# Patient Record
Sex: Female | Born: 1955 | Race: White | Hispanic: No | Smoking: Current every day smoker
Health system: Southern US, Community
[De-identification: ages and names within clinical notes are randomized; demographics above are authoritative.]

## PROBLEM LIST (undated history)

## (undated) DIAGNOSIS — I10 Essential (primary) hypertension: Secondary | ICD-10-CM

## (undated) DIAGNOSIS — F419 Anxiety disorder, unspecified: Secondary | ICD-10-CM

## (undated) HISTORY — PX: KIDNEY SURGERY: SHX687

---

## 2018-01-28 ENCOUNTER — Encounter (HOSPITAL_COMMUNITY): Payer: Self-pay | Admitting: Emergency Medicine

## 2018-01-28 ENCOUNTER — Emergency Department (HOSPITAL_COMMUNITY)
Admission: EM | Admit: 2018-01-28 | Discharge: 2018-01-28 | Disposition: A | Discharge status: Home / Self Care | Payer: Medicare Other | Attending: Emergency Medicine | Admitting: Emergency Medicine

## 2018-01-28 DIAGNOSIS — M25512 Pain in left shoulder: Secondary | ICD-10-CM | POA: Diagnosis not present

## 2018-01-28 DIAGNOSIS — R590 Localized enlarged lymph nodes: Secondary | ICD-10-CM | POA: Diagnosis not present

## 2018-01-28 DIAGNOSIS — M542 Cervicalgia: Secondary | ICD-10-CM | POA: Diagnosis present

## 2018-01-28 DIAGNOSIS — F17200 Nicotine dependence, unspecified, uncomplicated: Secondary | ICD-10-CM | POA: Diagnosis not present

## 2018-01-28 DIAGNOSIS — M25511 Pain in right shoulder: Secondary | ICD-10-CM | POA: Insufficient documentation

## 2018-01-28 HISTORY — DX: Anxiety disorder, unspecified: F41.9

## 2018-01-28 MED ORDER — CYCLOBENZAPRINE HCL 10 MG PO TABS: 10.0000 mg | ORAL_TABLET | Freq: Two times a day (BID) | ORAL | 0 refills | Status: DC | PRN

## 2018-01-28 NOTE — Discharge Instructions (Signed)
Please schedule an appointment with the ear, nose and throat doctor for further follow-up and evaluation of your enlarged lymph node.  Call the number listed below for Dr. Karren Burlywight.  Use muscle relaxer medicine called Flexeril.  This medicine can make you drowsy so please do not drive or work while taking it.  Continue to take Tylenol for neck strain and apply heat over the neck.  Your blood pressure was somewhat elevated in the emergency department today, please have this rechecked.  Return to the emergency department if you have fever greater than 100.4 F, sore throat or have any new or concerning symptoms.

## 2018-01-28 NOTE — ED Triage Notes (Signed)
Pt. Stated. Ive had a knot on my neck for over 4 months , unable to move my neck now.

## 2018-01-28 NOTE — ED Provider Notes (Signed)
[facility] EMERGENCY DEPARTMENT Provider Note   CSN: [id number] Arrival date & time: 01/28/18  4098     History   Chief Complaint Chief Complaint  Patient presents with  . Torticollis    HPI Evelyn Mitchell is a 62 y.o. Mitchell.  HPI   Evelyn Mitchell is a 62yo Mitchell with a history of anxiety who presents to the emergency department for evaluation of "knot over her left neck."  Patient reports that 4 months ago she noticed a knot over the left side of her neck which has enlarged and is tender to the touch.  She reports that it has made her entire neck "tight and painful." Patient reports 10/10 severity "stabbing pain in the neck, particularly with palpation over the knot or with neck rotation.  She also reports that the bilateral shoulders feel tight which has been giving her occasional migraine headaches.  She has been using BenGay, heat over the neck, aspirin without significant improvement in her pain.  She denies fevers, chills, night sweats, unexpected weight change, chest pain, shortness of breath, vision changes, numbness, weakness, nausea/vomiting.  She is able to swallow without difficulty.  Denies history of HIV or cancer.  She denies having painful lumps elsewhere.  Past Medical History:  Diagnosis Date  . Anxiety     There are no active problems to display for this patient.   History reviewed. No pertinent surgical history.   OB History   None      Home Medications    Prior to Admission medications   Not on File    Family History No family history on file.  Social History Social History   Tobacco Use  . Smoking status: Current Every Day Smoker  . Smokeless tobacco: Current User  Substance Use Topics  . Alcohol use: Not Currently  . Drug use: Not Currently     Allergies   Patient has no allergy information on record.   Review of Systems Review of Systems  Constitutional: Negative for chills, diaphoresis, fatigue, fever and  unexpected weight change.  HENT: Negative for congestion, rhinorrhea, sore throat and trouble swallowing.   Eyes: Negative for visual disturbance.  Respiratory: Negative for cough and shortness of breath.   Cardiovascular: Negative for chest pain.  Gastrointestinal: Negative for abdominal pain, nausea and vomiting.  Musculoskeletal: Positive for neck pain. Negative for neck stiffness.  Skin: Negative for rash.  Neurological: Negative for weakness and numbness.     Physical Exam Updated Vital Signs BP (!) 125/97 (BP Location: Right Arm)   Pulse 100   Temp 98.3 F (36.8 C)   Resp 18   Ht 5\' 5"  (1.651 m)   Wt 72.6 kg (160 lb)   SpO2 98%   BMI 26.63 kg/m   Physical Exam  Constitutional: She is oriented to person, place, and time. She appears well-developed and well-nourished. No distress.  HENT:  Head: Normocephalic and atraumatic.  Mouth/Throat: Oropharynx is clear and moist. No oropharyngeal exudate.  Eyes: Pupils are equal, round, and reactive to light. Conjunctivae are normal. Right eye exhibits no discharge. Left eye exhibits no discharge.  Neck:    One enlarged (2 cm) anterior cervical chain lymph node on the left.  It is immobile, tender to the touch.  No overlying erythema or rash on the skin.  No posterior chain, postauricular or preauricular lymphadenopathy. She also exhibits tenderness over bilateral upper trapezius muscles. Range of motion normal, although painful with rotation.  Cardiovascular: Normal rate, regular rhythm  and intact distal pulses. Exam reveals no friction rub.  No murmur heard. Pulmonary/Chest: Effort normal and breath sounds normal. No stridor. No respiratory distress. She has no wheezes. She has no rales.  Musculoskeletal:  No axillary or inguinal lymphadenopathy.  Neurological: She is alert and oriented to person, place, and time. Coordination normal.  Distal sensation to light touch intact in bilateral upper extremities.  Grip strength 5/5 in  bilateral upper extremities.  Skin: Skin is warm and dry. Capillary refill takes less than 2 seconds. She is not diaphoretic.  Psychiatric: She has a normal mood and affect. Her behavior is normal.  Nursing note and vitals reviewed.    ED Treatments / Results  Labs (all labs ordered are listed, but only abnormal results are displayed) Labs Reviewed - No data to display  EKG None  Radiology No results found.  Procedures Procedures (including critical care time)  Medications Ordered in ED Medications - No data to display   Initial Impression / Assessment and Plan / ED Course  I have reviewed the triage vital signs and the nursing notes.  Pertinent labs & imaging results that were available during my care of the patient were reviewed by me and considered in my medical decision making (see chart for details).     Patient with isolated tender cervical lymph node for the past four months now. Denies recent URI. Denies history of HIV or cancer. Denies fever, chills, night sweats, unexpected weight change. No erythema, warmth or induration overlying the skin. No axial or inguinal lymphadenopathy. No trismus or difficulty with range of motion of neck. Differential includes reactive lymph node vs. lymphoma.  Do not suspect peritonsillar abscess, retropharyngeal abscess or other deep space infection. No respiratory difficulty. Patient is swallowing well. No indication for advanced imaging at that time. Patient counseled to schedule an appointment with ENT for further f/u. Patient also exhibits tenderness and tightness over bilateral upper trapezius muscles, suspect this is related to positioning in the setting of trying to avoid laying on the left cervical node. Will d/c with muscle relaxer and instructions for heat and slow ROM activities. Discussed return precautions and she agrees. Discussed this patient with Dr. Charm BargesButler who agrees with above plan and appropriate follow up with ENT.   Final  Clinical Impressions(s) / ED Diagnoses   Final diagnoses:  LAD (lymphadenopathy) of left cervical region    ED Discharge Orders        Ordered    cyclobenzaprine (FLEXERIL) 10 MG tablet  2 times daily PRN     01/28/18 0947       Kellie ShropshireShrosbree, Madyn Ivins J, PA-C 01/29/18 0926    Terrilee FilesButler, Michael C, MD 01/30/18 [phone]

## 2018-03-06 DIAGNOSIS — R221 Localized swelling, mass and lump, neck: Secondary | ICD-10-CM | POA: Insufficient documentation

## 2018-03-07 ENCOUNTER — Other Ambulatory Visit: Payer: Self-pay | Admitting: Otolaryngology

## 2018-03-07 DIAGNOSIS — R221 Localized swelling, mass and lump, neck: Secondary | ICD-10-CM

## 2018-03-15 ENCOUNTER — Other Ambulatory Visit: Payer: Medicare Other

## 2018-03-21 ENCOUNTER — Ambulatory Visit
Admission: RE | Admit: 2018-03-21 | Discharge: 2018-03-21 | Disposition: A | Discharge status: Home / Self Care | Payer: Medicare Other | Source: Ambulatory Visit | Attending: Otolaryngology | Admitting: Otolaryngology

## 2018-03-21 DIAGNOSIS — R221 Localized swelling, mass and lump, neck: Secondary | ICD-10-CM

## 2018-03-21 MED ORDER — IOPAMIDOL (ISOVUE-300) INJECTION 61%
75.0000 mL | Freq: Once | INTRAVENOUS | Status: AC | PRN
  Administered 2018-03-21: 75 mL via INTRAVENOUS

## 2018-12-01 ENCOUNTER — Emergency Department (HOSPITAL_COMMUNITY)
Admission: EM | Admit: 2018-12-01 | Discharge: 2018-12-01 | Disposition: A | Discharge status: Home / Self Care | Payer: Medicare HMO | Attending: Emergency Medicine | Admitting: Emergency Medicine

## 2018-12-01 ENCOUNTER — Encounter (HOSPITAL_COMMUNITY): Payer: Self-pay | Admitting: Emergency Medicine

## 2018-12-01 ENCOUNTER — Emergency Department (HOSPITAL_COMMUNITY): Payer: Medicare HMO

## 2018-12-01 DIAGNOSIS — I10 Essential (primary) hypertension: Secondary | ICD-10-CM | POA: Insufficient documentation

## 2018-12-01 DIAGNOSIS — R519 Headache, unspecified: Secondary | ICD-10-CM

## 2018-12-01 DIAGNOSIS — F17210 Nicotine dependence, cigarettes, uncomplicated: Secondary | ICD-10-CM | POA: Insufficient documentation

## 2018-12-01 DIAGNOSIS — R51 Headache: Secondary | ICD-10-CM | POA: Insufficient documentation

## 2018-12-01 HISTORY — DX: Essential (primary) hypertension: I10

## 2018-12-01 LAB — COMPREHENSIVE METABOLIC PANEL
ALBUMIN: 4.1 g/dL (ref 3.5–5.0)
ALK PHOS: 62 U/L (ref 38–126)
ALT: 18 U/L (ref 0–44)
AST: 21 U/L (ref 15–41)
Anion gap: 10 (ref 5–15)
BUN: 11 mg/dL (ref 8–23)
CALCIUM: 8.9 mg/dL (ref 8.9–10.3)
CO2: 24 mmol/L (ref 22–32)
CREATININE: 0.76 mg/dL (ref 0.44–1.00)
Chloride: 104 mmol/L (ref 98–111)
GFR calc Af Amer: >60 mL/min (ref >60)
GFR calc non Af Amer: >60 mL/min (ref >60)
GLUCOSE: 108 mg/dL — AB (ref 70–99)
Potassium: 3.7 mmol/L (ref 3.5–5.1)
Sodium: 138 mmol/L (ref 135–145)
Total Bilirubin: 0.6 mg/dL (ref 0.3–1.2)
Total Protein: 6.2 g/dL — ABNORMAL LOW (ref 6.5–8.1)

## 2018-12-01 LAB — CBC WITH DIFFERENTIAL/PLATELET
ABS IMMATURE GRANULOCYTES: 0.05 10*3/uL (ref 0.00–0.07)
BASOS ABS: 0 10*3/uL (ref 0.0–0.1)
Basophils Relative: 0 %
Eosinophils Absolute: 0.2 10*3/uL (ref 0.0–0.5)
Eosinophils Relative: 2 %
HCT: 39.2 % (ref 36.0–46.0)
HEMOGLOBIN: 12.6 g/dL (ref 12.0–15.0)
IMMATURE GRANULOCYTES: 1 %
LYMPHS PCT: 24 %
Lymphs Abs: 2.2 10*3/uL (ref 0.7–4.0)
MCH: 32.1 pg (ref 26.0–34.0)
MCHC: 32.1 g/dL (ref 30.0–36.0)
MCV: 99.7 fL (ref 80.0–100.0)
MONO ABS: 0.6 10*3/uL (ref 0.1–1.0)
Monocytes Relative: 7 %
NEUTROS ABS: 5.9 10*3/uL (ref 1.7–7.7)
NRBC: 0 % (ref 0.0–0.2)
Neutrophils Relative %: 66 %
Platelets: 283 10*3/uL (ref 150–400)
RBC: 3.93 MIL/uL (ref 3.87–5.11)
RDW: 12.9 % (ref 11.5–15.5)
WBC: 8.9 10*3/uL (ref 4.0–10.5)

## 2018-12-01 MED ORDER — DIPHENHYDRAMINE HCL 50 MG/ML IJ SOLN
25.0000 mg | Freq: Once | INTRAMUSCULAR | Status: AC
  Administered 2018-12-01: 25 mg via INTRAVENOUS
  Filled 2018-12-01: qty 1

## 2018-12-01 MED ORDER — SODIUM CHLORIDE 0.9 % IV BOLUS
1000.0000 mL | Freq: Once | INTRAVENOUS | Status: AC
  Administered 2018-12-01: 1000 mL via INTRAVENOUS

## 2018-12-01 MED ORDER — DEXAMETHASONE SODIUM PHOSPHATE 10 MG/ML IJ SOLN
10.0000 mg | Freq: Once | INTRAMUSCULAR | Status: AC
  Administered 2018-12-01: 10 mg via INTRAVENOUS
  Filled 2018-12-01: qty 1

## 2018-12-01 MED ORDER — SODIUM CHLORIDE 0.9 % IV SOLN
INTRAVENOUS | Status: DC
  Administered 2018-12-01: 21:00:00 via INTRAVENOUS

## 2018-12-01 MED ORDER — METOCLOPRAMIDE HCL 5 MG/ML IJ SOLN
10.0000 mg | Freq: Once | INTRAMUSCULAR | Status: AC
  Administered 2018-12-01: 10 mg via INTRAVENOUS
  Filled 2018-12-01: qty 2

## 2018-12-01 NOTE — ED Provider Notes (Signed)
[facility] EMERGENCY DEPARTMENT Provider Note   CSN: [id number] Arrival date & time: 12/01/18  1644     History   Chief Complaint Chief Complaint  Patient presents with  . Migraine    HPI Arthi Tonn is a 63 y.o. female.  Patient with a history of migraines in the past.  Last time was about 3 years ago.  Has had a headache essentially constant for 2 months mostly in the back of the head.  No fevers occasionally some vomiting.  Occasionally some blurred vision.  Currently headache is 8 out of 10.  Patient's been under a lot of stress lately.  Patient denies any speech problems any weakness or sensory changes.     Past Medical History:  Diagnosis Date  . Anxiety   . Hypertension     There are no active problems to display for this patient.   History reviewed. No pertinent surgical history.   OB History   No obstetric history on file.      Home Medications    Prior to Admission medications   Medication Sig Start Date End Date Taking? Authorizing Provider  cyclobenzaprine (FLEXERIL) 10 MG tablet Take 1 tablet (10 mg total) by mouth 2 (two) times daily as needed for muscle spasms. 01/28/18   Kellie Shropshire, PA-C    Family History No family history on file.  Social History Social History   Tobacco Use  . Smoking status: Current Every Day Smoker  . Smokeless tobacco: Current User  Substance Use Topics  . Alcohol use: Not Currently  . Drug use: Not Currently     Allergies   Penicillins   Review of Systems Review of Systems  Constitutional: Negative for chills and fever.  HENT: Negative for rhinorrhea and sore throat.   Eyes: Positive for visual disturbance.  Respiratory: Negative for cough and shortness of breath.   Cardiovascular: Negative for chest pain and leg swelling.  Gastrointestinal: Positive for nausea and vomiting. Negative for abdominal pain and diarrhea.  Genitourinary: Negative for dysuria.  Musculoskeletal:  Negative for back pain and neck pain.  Skin: Negative for rash.  Neurological: Positive for headaches. Negative for dizziness and light-headedness.  Hematological: Does not bruise/bleed easily.  Psychiatric/Behavioral: Negative for confusion.     Physical Exam Updated Vital Signs BP (!) 143/73   Pulse 73   Temp 97.9 F (36.6 C) (Oral)   Resp 18   SpO2 97%   Physical Exam Vitals signs and nursing note reviewed.  Constitutional:      General: She is not in acute distress.    Appearance: She is well-developed.  HENT:     Head: Normocephalic and atraumatic.     Mouth/Throat:     Mouth: Mucous membranes are moist.  Eyes:     Extraocular Movements: Extraocular movements intact.     Conjunctiva/sclera: Conjunctivae normal.     Pupils: Pupils are equal, round, and reactive to light.  Neck:     Musculoskeletal: Normal range of motion and neck supple. No neck rigidity.  Cardiovascular:     Rate and Rhythm: Normal rate and regular rhythm.     Heart sounds: No murmur.  Pulmonary:     Effort: Pulmonary effort is normal. No respiratory distress.     Breath sounds: Normal breath sounds.  Abdominal:     Palpations: Abdomen is soft.     Tenderness: There is no abdominal tenderness.  Skin:    General: Skin is warm and dry.  Capillary Refill: Capillary refill takes less than 2 seconds.  Neurological:     General: No focal deficit present.     Mental Status: She is alert and oriented to person, place, and time.     Cranial Nerves: No cranial nerve deficit.     Sensory: No sensory deficit.     Motor: No weakness.     Comments: No significant neurofocal deficits.      ED Treatments / Results  Labs (all labs ordered are listed, but only abnormal results are displayed) Labs Reviewed  COMPREHENSIVE METABOLIC PANEL - Abnormal; Notable for the following components:      Result Value   Glucose, Bld 108 (*)    Total Protein 6.2 (*)    All other components within normal limits    CBC WITH DIFFERENTIAL/PLATELET    EKG None  Radiology Ct Head Wo Contrast  Result Date: 12/01/2018 CLINICAL DATA:  Chronic headaches EXAM: CT HEAD WITHOUT CONTRAST TECHNIQUE: Contiguous axial images were obtained from the base of the skull through the vertex without intravenous contrast. COMPARISON:  03/21/2018 FINDINGS: Brain: No evidence of acute infarction, hemorrhage, hydrocephalus, extra-axial collection or mass lesion/mass effect. Vascular: No hyperdense vessel or unexpected calcification. Skull: Normal. Negative for fracture or focal lesion. Sinuses/Orbits: Paranasal sinuses are well aerated. Small air-fluid level is noted within the right maxillary antrum. Some mucosal changes in the left maxillary antrum are seen. The orbits are within normal limits. Other: None. IMPRESSION: Mild sinus disease in the maxillary antra bilaterally. No acute intracranial abnormality is seen. Electronically Signed   By: Alcide Clever M.D.   On: 12/01/2018 20:28    Procedures Procedures (including critical care time)  Medications Ordered in ED Medications  0.9 %  sodium chloride infusion ( Intravenous New Bag/Given 12/01/18 2037)  sodium chloride 0.9 % bolus 1,000 mL (1,000 mLs Intravenous New Bag/Given 12/01/18 2036)  dexamethasone (DECADRON) injection 10 mg (10 mg Intravenous Given 12/01/18 2041)  diphenhydrAMINE (BENADRYL) injection 25 mg (25 mg Intravenous Given 12/01/18 2040)  metoCLOPramide (REGLAN) injection 10 mg (10 mg Intravenous Given 12/01/18 2037)     Initial Impression / Assessment and Plan / ED Course  I have reviewed the triage vital signs and the nursing notes.  Pertinent labs & imaging results that were available during my care of the patient were reviewed by me and considered in my medical decision making (see chart for details).    Patient treated as if was migraine.  Also had CT head without any acute findings.  Patient got migraine cocktail to include Decadron Reglan and Benadryl.   Patient slowly starting to improve and feel better.  Patient will need follow-up.  Given referral information to the [facility] neurology.   Final Clinical Impressions(s) / ED Diagnoses   Final diagnoses:  Headache disorder    ED Discharge Orders    None       Vanetta Mulders, MD 12/01/18 2257

## 2018-12-01 NOTE — Discharge Instructions (Addendum)
Rest for the next 24 hours.  Quiet environment.  Make an appointment on Monday to follow-up with the [facility] neurology.  Today's work-up without any significant findings.  Hopefully the medications we gave you here tonight will eventually eradicate the headache.  Return for any new or worse symptoms.

## 2019-02-05 ENCOUNTER — Encounter: Payer: Self-pay | Admitting: Neurology

## 2019-02-12 ENCOUNTER — Ambulatory Visit: Payer: Medicare HMO | Admitting: Neurology

## 2019-04-05 IMAGING — CT CT HEAD W/O CM
4 series · 15 of 47 positions shown, 17 images · non-contrast
Comparison: 03/21/2018

CLINICAL DATA: Chronic headaches

EXAM:
CT HEAD WITHOUT CONTRAST
TECHNIQUE: Contiguous axial images were obtained from the base of the skull
through the vertex without intravenous contrast.

[Series 3: head without · axial · non-contrast · 0.42mm/px · z∈[-69,+51]mm · 7 of 33 slices shown, 9 images]
[im 5/33  brain]
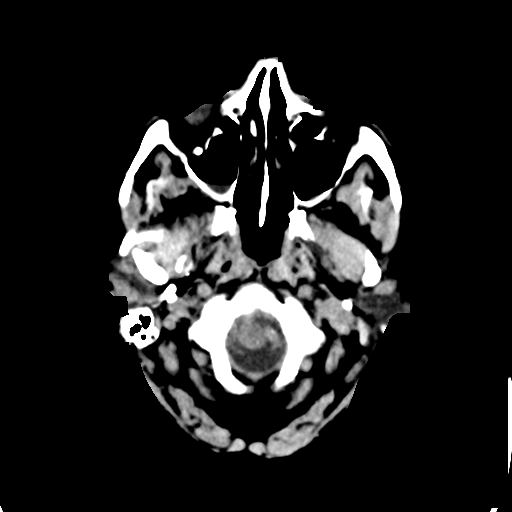
[im 5/33  bone]
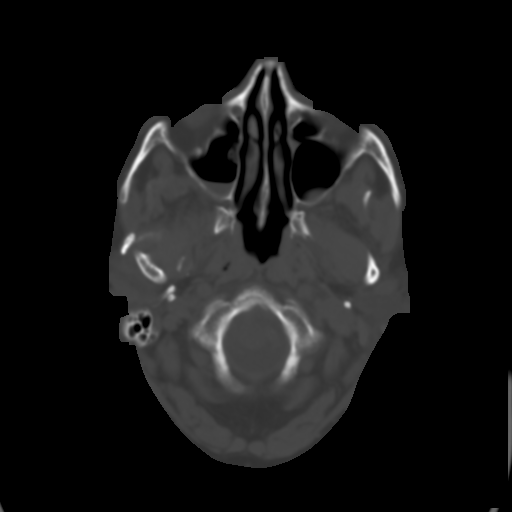
[im 9/33  brain]
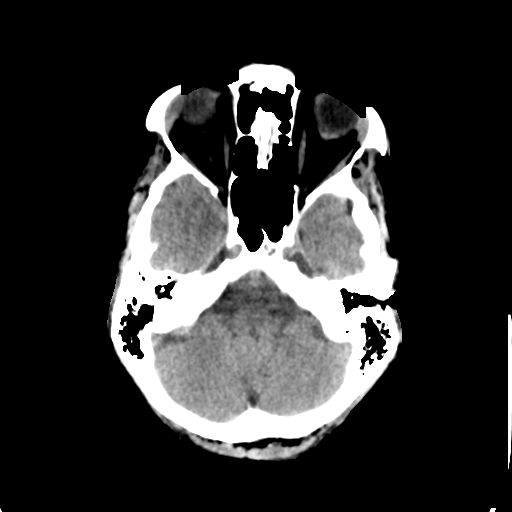
[im 13/33  brain]
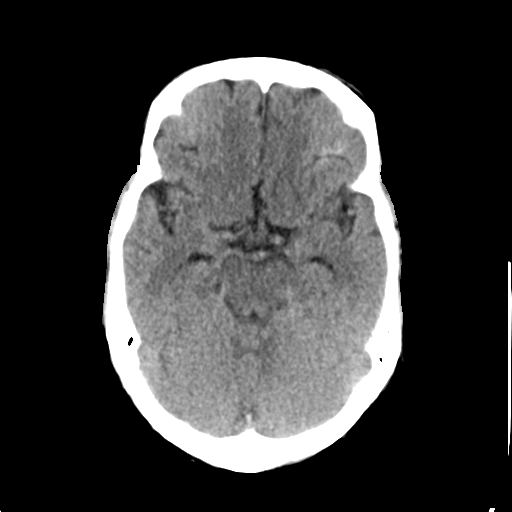
[im 17/33  brain]
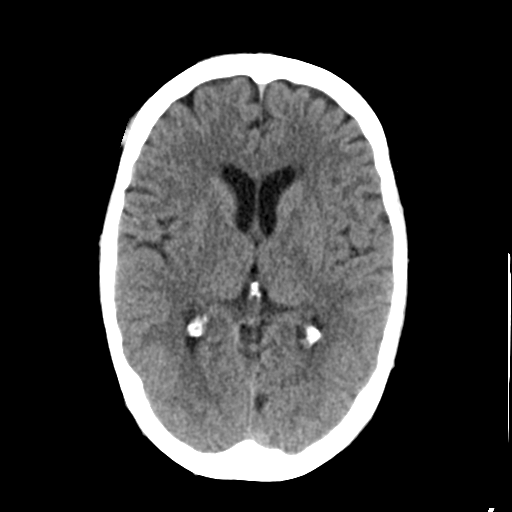
[im 21/33  brain]
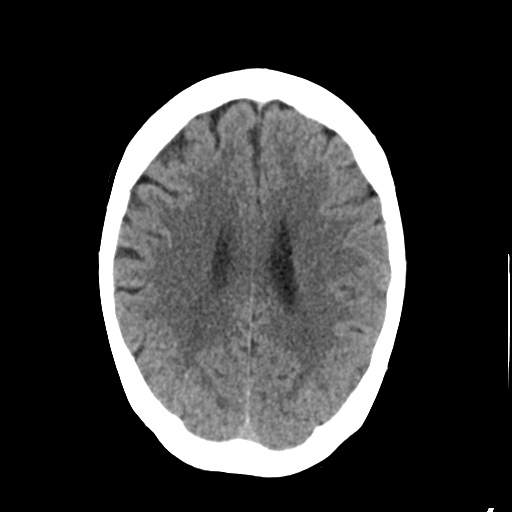
[im 21/33  bone]
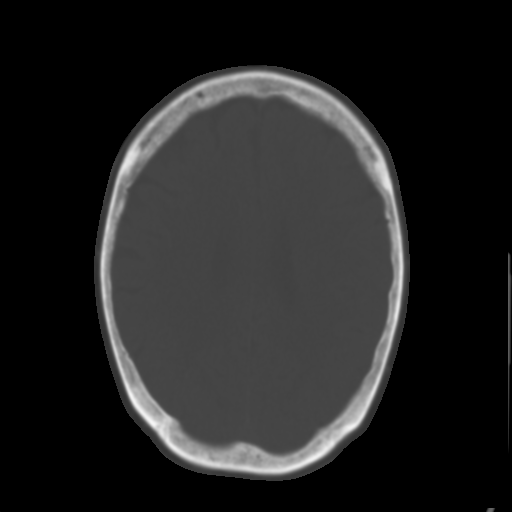
[im 25/33  brain]
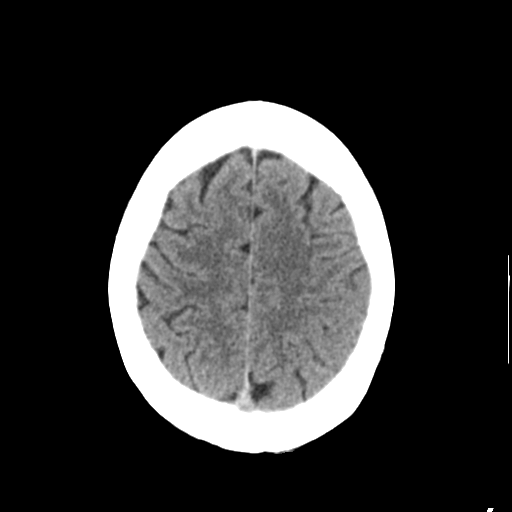
[im 29/33  brain]
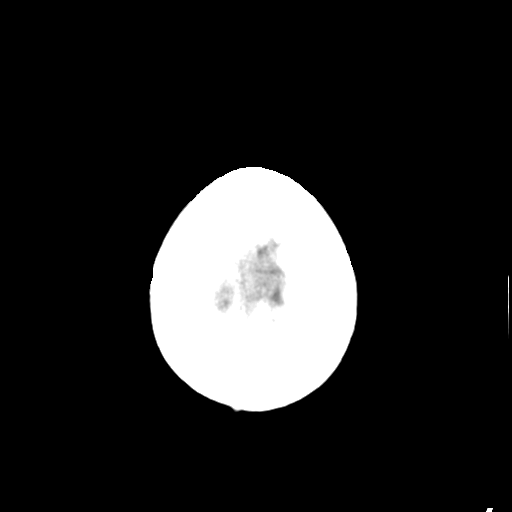

[Series 4: head bone · axial · 0.42mm/px · z∈[-73,-57]mm · 2 of 83 slices shown]
[im 9/83  bone]
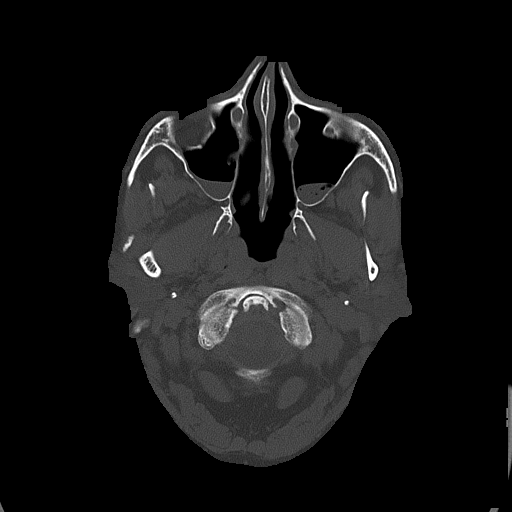
[im 17/83  bone]
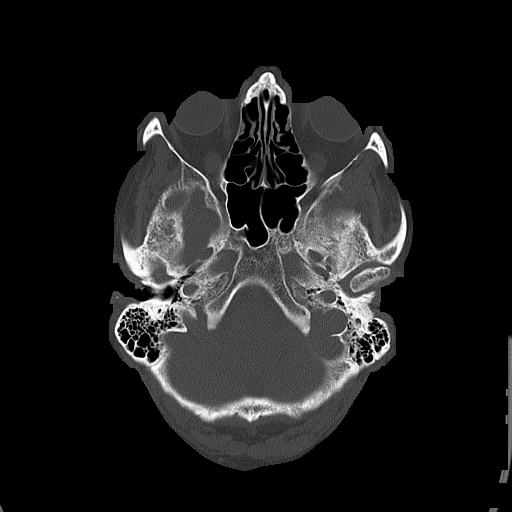

[Series 5: head without cor · coronal · non-contrast · 0.32mm/px · 3 of 69 slices shown]
[im 23/69  brain]
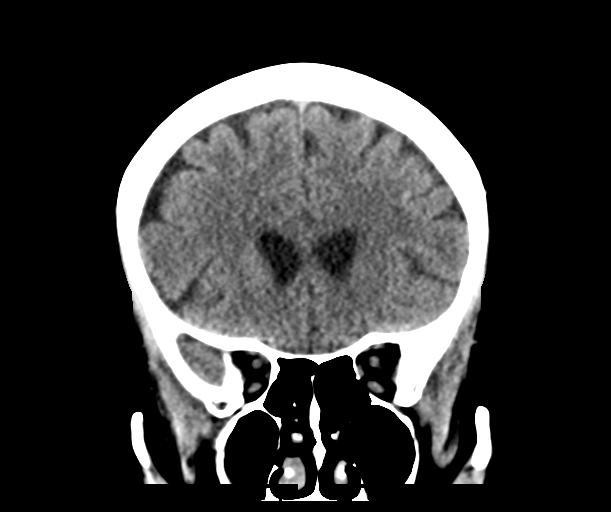
[im 31/69  brain]
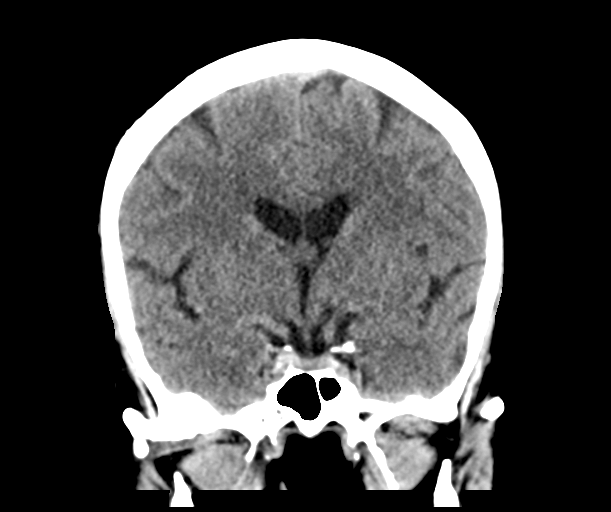
[im 38/69  brain]
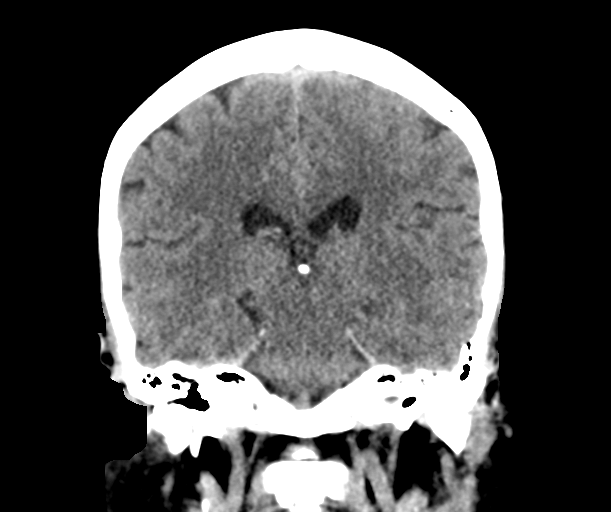

[Series 6: head without sag · sagittal · non-contrast · 0.32mm/px · 3 of 64 slices shown]
[im 22/64  brain]
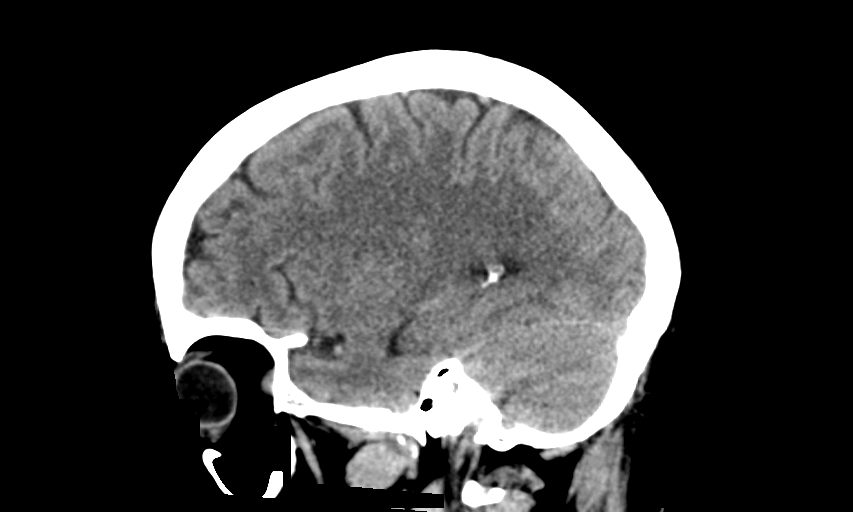
[im 32/64  brain]
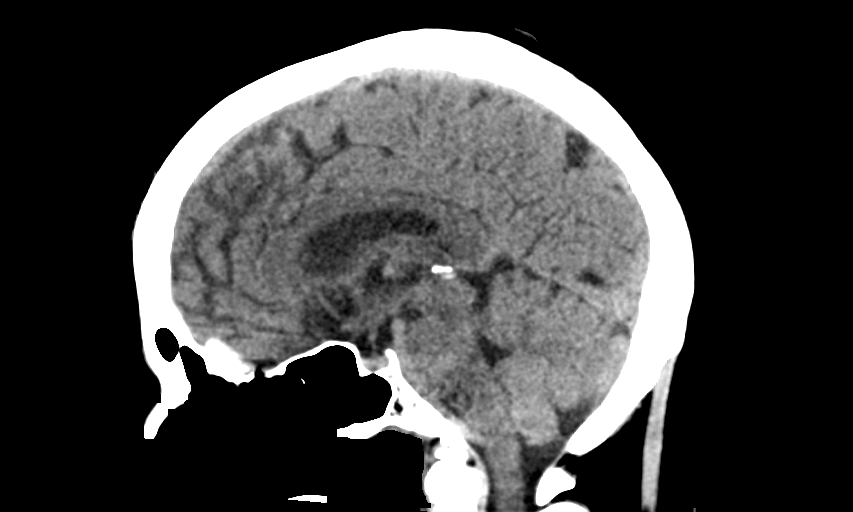
[im 43/64  brain]
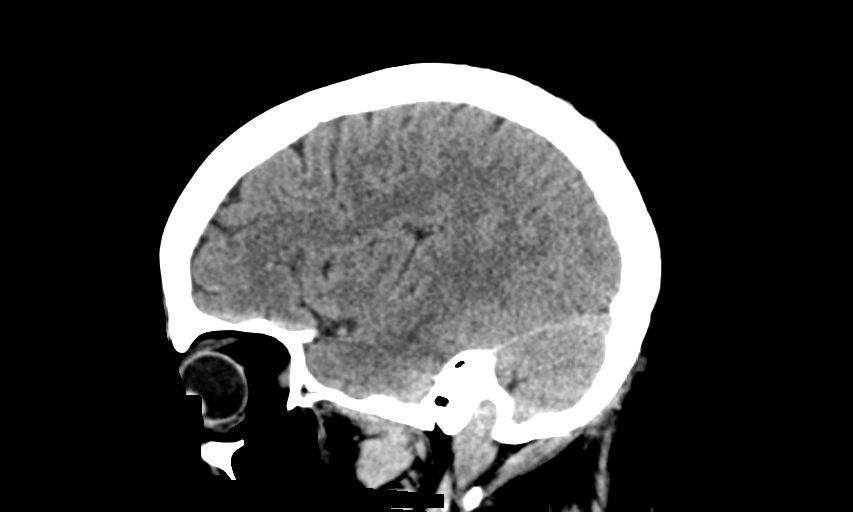

[15 of 47 positions shown; findings below may reference images not displayed]

FINDINGS: Brain: No evidence of acute infarction, hemorrhage, hydrocephalus,
extra-axial collection or mass lesion/mass effect.

Vascular: No hyperdense vessel or unexpected calcification.

Skull: Normal. Negative for fracture or focal lesion.

Sinuses/Orbits: Paranasal sinuses are well aerated. Small air-fluid
level is noted within the right maxillary antrum. Some mucosal
changes in the left maxillary antrum are seen. The orbits are within
normal limits.

Other: None.
IMPRESSION: Mild sinus disease in the maxillary antra bilaterally.

No acute intracranial abnormality is seen.

## 2020-04-29 ENCOUNTER — Ambulatory Visit: Payer: Medicare HMO | Admitting: Family Medicine

## 2020-05-06 ENCOUNTER — Ambulatory Visit: Payer: Medicare HMO | Admitting: Family Medicine

## 2020-05-08 ENCOUNTER — Telehealth: Payer: Self-pay | Admitting: Family Medicine

## 2020-05-08 ENCOUNTER — Encounter: Payer: Self-pay | Admitting: Family Medicine

## 2020-05-08 NOTE — Telephone Encounter (Signed)
Okay.  Thanks.

## 2020-05-08 NOTE — Telephone Encounter (Signed)
Pt was no show for appt 05/06/2020 new patient appt. 1st no show, fee waived, letter mailed.

## 2021-02-16 ENCOUNTER — Ambulatory Visit: Payer: Medicare HMO | Admitting: Family Medicine

## 2021-02-18 ENCOUNTER — Ambulatory Visit: Payer: Medicare HMO | Admitting: Family Medicine

## 2021-03-27 ENCOUNTER — Other Ambulatory Visit: Payer: Self-pay | Admitting: Family Medicine

## 2021-03-27 ENCOUNTER — Other Ambulatory Visit: Payer: Self-pay

## 2021-03-27 ENCOUNTER — Encounter: Payer: Self-pay | Admitting: Family Medicine

## 2021-03-27 ENCOUNTER — Ambulatory Visit (INDEPENDENT_AMBULATORY_CARE_PROVIDER_SITE_OTHER): Payer: Medicare HMO | Admitting: Family Medicine

## 2021-03-27 VITALS — BP 136/84 | HR 90 | Temp 97.7°F | Ht 64.0 in | Wt 179.2 lb

## 2021-03-27 DIAGNOSIS — R221 Localized swelling, mass and lump, neck: Secondary | ICD-10-CM | POA: Diagnosis not present

## 2021-03-27 DIAGNOSIS — G43709 Chronic migraine without aura, not intractable, without status migrainosus: Secondary | ICD-10-CM | POA: Diagnosis not present

## 2021-03-27 DIAGNOSIS — R040 Epistaxis: Secondary | ICD-10-CM | POA: Diagnosis not present

## 2021-03-27 DIAGNOSIS — R6889 Other general symptoms and signs: Secondary | ICD-10-CM | POA: Diagnosis not present

## 2021-03-27 DIAGNOSIS — Z1211 Encounter for screening for malignant neoplasm of colon: Secondary | ICD-10-CM | POA: Diagnosis not present

## 2021-03-27 DIAGNOSIS — Z13220 Encounter for screening for lipoid disorders: Secondary | ICD-10-CM | POA: Diagnosis not present

## 2021-03-27 DIAGNOSIS — H538 Other visual disturbances: Secondary | ICD-10-CM | POA: Diagnosis not present

## 2021-03-27 DIAGNOSIS — Z114 Encounter for screening for human immunodeficiency virus [HIV]: Secondary | ICD-10-CM | POA: Diagnosis not present

## 2021-03-27 DIAGNOSIS — L282 Other prurigo: Secondary | ICD-10-CM | POA: Diagnosis not present

## 2021-03-27 DIAGNOSIS — G43909 Migraine, unspecified, not intractable, without status migrainosus: Secondary | ICD-10-CM | POA: Insufficient documentation

## 2021-03-27 DIAGNOSIS — Z13820 Encounter for screening for osteoporosis: Secondary | ICD-10-CM

## 2021-03-27 DIAGNOSIS — Z1231 Encounter for screening mammogram for malignant neoplasm of breast: Secondary | ICD-10-CM

## 2021-03-27 DIAGNOSIS — L723 Sebaceous cyst: Secondary | ICD-10-CM

## 2021-03-27 DIAGNOSIS — Z1159 Encounter for screening for other viral diseases: Secondary | ICD-10-CM

## 2021-03-27 NOTE — Patient Instructions (Signed)
Use saline nasal sprya once daily for nose.  Speak to pharmacist about tetanus vaccine and pneumococcal (PCV-20) vaccine.

## 2021-03-27 NOTE — Progress Notes (Signed)
[facility] [address] [place] [address] Dept: [phone] Dept Fax: [phone]  New Patient Office Visit  Subjective:    Patient ID: Evelyn Mitchell, female    DOB: [birth year], 65 y.o..   MRN: [id number]  Chief Complaint  Patient presents with  . Establish Care    NP- establish care.  C/o having migraine HA's 6 months  She has been taking Advil with little relief.     History of Present Illness:  Patient is in today to establish care. Evelyn Mitchell is originally from [place]. She moved to [facility] in Nov. 2018 to live with her son. She notes that this did not end up well. She describes being required by her son and his wife to turn over all but $50 a month from money she was receiving and being locked in her room most of the day. She eventually reached out to social services who helped her get into alternative housing. She has been on her own for about 6 months. 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 Evelyn Mitchell is divorced. She has two sons. One son is int he [employer] stationed in [place]. [name] admits to smoking about 1 pack per month of cigarettes (down from 1/2 ppd). She denies alcohol or drug use.  Ms. Faye notes a history of "migraine headaches" that began about two years ago. She had not had headaches at a younger age. These involve the entire head and are often associated with nausea and sometimes vomiting. She is not necessarily having any aura with these. She notes they occur 2-3 times a week. She was seen in the ED in Jan. 2020 for headaches and had a CT scan of the head. This was normal. She usually takes either Tylenol or Advil PM for the headaches when they occur. The headaches can last 24 hours, but do go away.  Evelyn Mitchell notes that for the past month, she has had 4 nosebleeds. Each time she has bleeding from the left nostril. These resolve with  pressure.  Evelyn Mitchell notes she has some occasional visual blurring. She has noted this for about 8 months. She denies any polyuria or nocturia.  Evelyn Mitchell notes she has a rash on her right arm for about 3 days. She does have cats at home, but has checked them for fleas. She also had maintenance at her housing check for insects (esp. bed bugs) at her apartment, but is told it looks fine.  Evelyn Mitchell notes she has a lump over her right neck that has been present for some time now. She notes that at times this drains afoul smelling discharge. In 2019, she had evaluation of a lump in her left neck. A CT scan was performed, but noted no specific abnormality.  Past Medical History: Patient Active Problem List   Diagnosis Date Noted  . Migraine headache 03/27/2021  . Epistaxis 03/27/2021  . Mass of left side of neck 03/06/2018   Past Surgical History:  Procedure Laterality Date  . CESAREAN SECTION     x2  . KIDNEY SURGERY     Family History  Problem Relation Age of Onset  . Cancer Mother        ovarian  . Stroke Mother   . Heart disease Father   . Hyperlipidemia Father   . Hypertension Father   . Arthritis Father   . Diabetes Father  Outpatient Medications Prior to Visit  Medication Sig Dispense Refill  . cyclobenzaprine (FLEXERIL) 10 MG tablet Take 1 tablet (10 mg total) by mouth 2 (two) times daily as needed for muscle spasms. (Patient not taking: Reported on 03/27/2021) 20 tablet 0   No facility-administered medications prior to visit.   Allergies  Allergen Reactions  . Penicillins Itching    Objective:   Today's Vitals   03/27/21 1305 03/27/21 1311  BP: (!) 150/100 136/84  Pulse: 90   Temp: 97.7 F (36.5 C)   TempSrc: Temporal   SpO2: 97%   Weight: 179 lb 3.2 oz (81.3 kg)   Height: 5\' 4"  (1.626 m)    Body mass index is 30.76 kg/m.   General: Well developed, well nourished. No acute distress. HEENT: Normocephalic, non-traumatic. PERRL, EOMI. Conjunctiva  clear. Fundiscopic exam shows   normal disc and vasculature. External ears normal. EAC and TMs normal bilaterally. Nose    clear without congestion or rhinorrhea. No sores or bleeding seen in the nose. Mucous membranes   moist. Oropharynx clear. Edentulous. Neck: Supple. No lymphadenopathy. No thyromegaly. There is a full feeling in the left upper neck. Lungs: Clear to auscultation bilaterally. No wheezing, rales or rhonchi. CV: RRR without murmurs or rubs. Pulses 2+ bilaterally. Abdomen: Soft, non-tender. Bowel sounds positive, normal pitch and frequency. No  hepatosplenomegaly. No rebound or guarding. Extremities: Full ROM. No joint swelling or tenderness. No edema noted. Skin: Warm and dry. There is a 1-2 cm nodule noted under the skin of the right neck overlying the   trapezius.  Psych: Alert and oriented. Normal mood and affect.  Health Maintenance Due  Topic Date Due  . Hepatitis C Screening  Never done  . TETANUS/TDAP  Never done  . COLONOSCOPY (Pts 45-55yrs Insurance coverage will need to be confirmed)  Never done  . MAMMOGRAM  Never done  . DEXA SCAN  Never done  . PNA vac Low Risk Adult (1 of 2 - PCV13) Never done     Assessment & Plan:   1. Chronic migraine without aura without status migrainosus, not intractable [name] has a history of headaches, though it is unclear to me that these are true migraines, as she only started experiencing them in the past two years. I reviewed the results of her prior CT scan, which is reassuring. I will check a BMP to assess for other potential causes. I will plan to have her return soon and reassess her blood pressure. I may consider starting her on metoprolol for headache prophylaxis and see if she responds to this.   - Basic metabolic panel  2. Epistaxis No sign of bleeding today. I will check a CBC to make sure there is no anemia and that she has normal platelets. I advised her to use nasal saline spray to hydrate her nose. We will  follow this for now. - CBC  3. Mass of left side of neck I do feel the area in question and this feels full. I reviewed the prior CT scan results. We will monitor this for now.  4. Blurry vision, bilateral In light of the complaint of blurry vision, I will check a HbA1c to screen for diabetes.  - Hemoglobin A1c  5. Pruritic rash The rash appears to possibly be due to insect bites. I recommend she try using hydrocortisone cream and we will reassess at her next visit.  6. Screening for lipid disorders  - Lipid panel  7. Encounter for hepatitis C screening  test for low risk patient  - HCV Ab w Reflex to Quant PCR  8. Screening for HIV (human immunodeficiency virus)  - HIV Antibody (routine testing w rflx)  9. Screening for colon cancer  - Ambulatory referral to Gastroenterology  10. Encounter for screening mammogram for malignant neoplasm of breast  - MM DIGITAL SCREENING BILATERAL; Future  11. Screening for osteoporosis  - DG Bone Density; Future  12. Sebaceous cyst We discussed the benign nature of the lesion. I recommend we reschedule her for a visit to excise the cyst.  Loyola Mast, MD

## 2021-03-28 LAB — BASIC METABOLIC PANEL
BUN: 14 mg/dL (ref 7–25)
CO2: 27 mmol/L (ref 20–32)
Calcium: 9.5 mg/dL (ref 8.6–10.4)
Chloride: 106 mmol/L (ref 98–110)
Creat: 0.58 mg/dL (ref 0.50–0.99)
Glucose, Bld: 99 mg/dL (ref 65–99)
Potassium: 4.7 mmol/L (ref 3.5–5.3)
Sodium: 142 mmol/L (ref 135–146)

## 2021-03-28 LAB — CBC
HCT: 40.3 % (ref 35.0–45.0)
Hemoglobin: 13.8 g/dL (ref 11.7–15.5)
MCH: 33.3 pg — ABNORMAL HIGH (ref 27.0–33.0)
MCHC: 34.2 g/dL (ref 32.0–36.0)
MCV: 97.3 fL (ref 80.0–100.0)
MPV: 8.9 fL (ref 7.5–12.5)
Platelets: 342 10*3/uL (ref 140–400)
RBC: 4.14 10*6/uL (ref 3.80–5.10)
RDW: 12.4 % (ref 11.0–15.0)
WBC: 7.4 10*3/uL (ref 3.8–10.8)

## 2021-03-28 LAB — HCV INTERPRETATION

## 2021-03-28 LAB — LIPID PANEL
Cholesterol: 226 mg/dL — ABNORMAL HIGH (ref <200)
HDL: 61 mg/dL (ref >=50)
LDL Cholesterol (Calc): 126 mg/dL (calc) — ABNORMAL HIGH
Non-HDL Cholesterol (Calc): 165 mg/dL (calc) — ABNORMAL HIGH (ref <130)
Total CHOL/HDL Ratio: 3.7 (calc) (ref <5.0)
Triglycerides: 255 mg/dL — ABNORMAL HIGH (ref <150)

## 2021-03-28 LAB — HIV ANTIBODY (ROUTINE TESTING W REFLEX): HIV Screen 4th Generation wRfx: NONREACTIVE

## 2021-03-28 LAB — HEMOGLOBIN A1C
Hgb A1c MFr Bld: 5.4 % of total Hgb (ref <5.7)
Mean Plasma Glucose: 108 mg/dL
eAG (mmol/L): 6 mmol/L

## 2021-03-28 LAB — HCV AB W REFLEX TO QUANT PCR: HCV Ab: <0.1 s/co ratio (ref 0.0–0.9)

## 2021-03-30 ENCOUNTER — Encounter: Payer: Self-pay | Admitting: Family Medicine

## 2021-03-30 DIAGNOSIS — E785 Hyperlipidemia, unspecified: Secondary | ICD-10-CM | POA: Insufficient documentation

## 2021-04-01 LAB — HIV ANTIBODY (ROUTINE TESTING W REFLEX)

## 2021-04-01 LAB — HCV AB W REFLEX TO QUANT PCR

## 2021-05-21 ENCOUNTER — Other Ambulatory Visit: Payer: Medicare HMO

## 2021-08-01 ENCOUNTER — Telehealth: Payer: Self-pay

## 2021-08-01 ENCOUNTER — Ambulatory Visit: Payer: Medicare HMO

## 2021-08-01 NOTE — Telephone Encounter (Signed)
Called pt to do AWV. Left Vm for pt to call the office to reschedule appt.  KP

## 2021-11-06 ENCOUNTER — Telehealth: Payer: Self-pay | Admitting: Family Medicine

## 2021-11-06 NOTE — Telephone Encounter (Signed)
I called patient to schedule AWV.  Patient said she has switched to a new PCP.  I asked her who she switched to and she hung up.  Please remove PCP.

## 2022-03-29 ENCOUNTER — Other Ambulatory Visit: Payer: Self-pay | Admitting: Physician Assistant

## 2022-03-29 DIAGNOSIS — Z1231 Encounter for screening mammogram for malignant neoplasm of breast: Secondary | ICD-10-CM

## 2022-05-19 ENCOUNTER — Ambulatory Visit (INDEPENDENT_AMBULATORY_CARE_PROVIDER_SITE_OTHER): Payer: Medicare Other | Admitting: Podiatry

## 2022-05-19 ENCOUNTER — Ambulatory Visit (INDEPENDENT_AMBULATORY_CARE_PROVIDER_SITE_OTHER): Payer: Medicare Other

## 2022-05-19 DIAGNOSIS — M722 Plantar fascial fibromatosis: Secondary | ICD-10-CM

## 2022-05-19 MED ORDER — MELOXICAM 15 MG PO TABS: 15.0000 mg | ORAL_TABLET | Freq: Every day | ORAL | 1 refills | Status: DC

## 2022-05-19 MED ORDER — METHYLPREDNISOLONE 4 MG PO TBPK: ORAL_TABLET | ORAL | 0 refills | Status: AC

## 2022-05-19 MED ORDER — BETAMETHASONE SOD PHOS & ACET 6 (3-3) MG/ML IJ SUSP
3.0000 mg | Freq: Once | INTRAMUSCULAR | Status: AC
  Administered 2022-05-19: 3 mg via INTRA_ARTICULAR

## 2022-05-19 NOTE — Progress Notes (Signed)
   Subjective: 66 y.o. female is a new patient for evaluation of right heel pain has been going on for about a month.  Idiopathic onset.  She cannot recall an incident or injury that would have elicited the pain.  She also noticed bruising initially but it has since resolved.  She has not done anything for treatment.  She presents for further treatment evaluation   Past Medical History:  Diagnosis Date   Anxiety    Hypertension    Past Surgical History:  Procedure Laterality Date   CESAREAN SECTION     x2   KIDNEY SURGERY     Allergies  Allergen Reactions   Penicillins Itching   Objective: Physical Exam General: The patient is alert and oriented x3 in no acute distress.  Dermatology: Skin is warm, dry and supple bilateral lower extremities. Negative for open lesions or macerations bilateral.   Vascular: Dorsalis Pedis and Posterior Tibial pulses palpable bilateral.  Capillary fill time is immediate to all digits.  Neurological: Epicritic and protective threshold intact bilateral.   Musculoskeletal: Tenderness to palpation to the plantar aspect of the right heel along the plantar fascia. All other joints range of motion within normal limits bilateral. Strength 5/5 in all groups bilateral.   Radiographic exam: Normal osseous mineralization. Joint spaces preserved. No fracture/dislocation/boney destruction. No other soft tissue abnormalities or radiopaque foreign bodies.   Assessment: 1. Plantar fasciitis right  Plan of Care:  1. Patient evaluated. Xrays reviewed.   2. Injection of 0.5cc Celestone soluspan injected into the right plantar fascia  3. Rx for Medrol Dose Pack placed 4. Rx for Meloxicam ordered for patient. 5. Plantar fascial band(s) dispensed 6. Instructed patient regarding therapies and modalities at home to alleviate symptoms.  7. Return to clinic in 4 weeks.     Felecia Shelling, DPM [facility]  Dr. Felecia Shelling, DPM    [address]  [place], [place] [address]                Office [phone]  Fax [phone]

## 2022-06-16 ENCOUNTER — Ambulatory Visit (INDEPENDENT_AMBULATORY_CARE_PROVIDER_SITE_OTHER): Payer: Medicare Other

## 2022-06-16 ENCOUNTER — Ambulatory Visit (INDEPENDENT_AMBULATORY_CARE_PROVIDER_SITE_OTHER): Payer: Medicare Other | Admitting: Podiatry

## 2022-06-16 DIAGNOSIS — M722 Plantar fascial fibromatosis: Secondary | ICD-10-CM

## 2022-06-16 MED ORDER — BETAMETHASONE SOD PHOS & ACET 6 (3-3) MG/ML IJ SUSP: 3.0000 mg | Freq: Once | INTRAMUSCULAR | Status: AC

## 2022-06-16 NOTE — Progress Notes (Signed)
   Chief Complaint  Patient presents with   Follow-up    Patient is here for 4 week follow-up plantar fasciitis right foot, patient states that the left foot is starting to hurt just like the right foot.     Subjective: 66 y.o. female presenting for follow-up evaluation of plantar fasciitis to the right foot.  Patient states that the injection helped for about 1 week.  The pain slowly returned.  She continues to have pain and tenderness to the right plantar heel.  She has been wearing the plantar fascial brace and taking the meloxicam as instructed.  She also completed the Medrol Dosepak  Past Medical History:  Diagnosis Date   Anxiety    Hypertension    Past Surgical History:  Procedure Laterality Date   CESAREAN SECTION     x2   KIDNEY SURGERY     Allergies  Allergen Reactions   Penicillins Itching   Objective: Physical Exam General: The patient is alert and oriented x3 in no acute distress.  Dermatology: Skin is warm, dry and supple bilateral lower extremities. Negative for open lesions or macerations bilateral.   Vascular: Dorsalis Pedis and Posterior Tibial pulses palpable bilateral.  Capillary fill time is immediate to all digits.  Neurological: Epicritic and protective threshold intact bilateral.   Musculoskeletal: There continues to be tenderness to palpation to the plantar aspect of the right heel along the plantar fascia. All other joints range of motion within normal limits bilateral. Strength 5/5 in all groups bilateral.   Radiographic exam RT foot 05/19/2022: Normal osseous mineralization. Joint spaces preserved. No fracture/dislocation/boney destruction. No other soft tissue abnormalities or radiopaque foreign bodies.   Assessment: 1. Plantar fasciitis right  Plan of Care:  1. Patient evaluated. Xrays reviewed.   2. Injection of 0.5cc Celestone soluspan injected into the right plantar fascia  3.  Continue meloxicam 15 mg daily 4.  Continue to advise  against going barefoot.  Recommend good supportive shoes and sneakers with arch supports 5.  Continue plantar fascial brace 6.  Return to clinic 4 weeks   Felecia Shelling, DPM [facility]  Dr. Felecia Shelling, DPM    [address] [place], [place] [address]                Office [phone]  Fax [phone]

## 2022-07-14 ENCOUNTER — Ambulatory Visit: Payer: Medicare Other | Admitting: Podiatry

## 2022-07-20 ENCOUNTER — Other Ambulatory Visit: Payer: Self-pay | Admitting: Podiatry
